# Patient Record
Sex: Male | Born: 1980 | Race: White | Hispanic: No | Marital: Single | Smoking: Current every day smoker
Health system: Southern US, Community
[De-identification: ages and names within clinical notes are randomized; demographics above are authoritative.]

## PROBLEM LIST (undated history)

## (undated) DIAGNOSIS — I10 Essential (primary) hypertension: Secondary | ICD-10-CM

## (undated) HISTORY — DX: Essential (primary) hypertension: I10

---

## 1999-08-13 ENCOUNTER — Other Ambulatory Visit: Admission: RE | Admit: 1999-08-13 | Discharge: 1999-08-13 | Payer: Self-pay | Admitting: Otolaryngology

## 2004-02-01 ENCOUNTER — Ambulatory Visit: Payer: Self-pay | Admitting: Internal Medicine

## 2004-08-05 ENCOUNTER — Emergency Department (HOSPITAL_COMMUNITY): Admission: EM | Admit: 2004-08-05 | Discharge: 2004-08-05 | Payer: Self-pay | Admitting: Emergency Medicine

## 2010-08-05 ENCOUNTER — Inpatient Hospital Stay (INDEPENDENT_AMBULATORY_CARE_PROVIDER_SITE_OTHER)
Admission: RE | Admit: 2010-08-05 | Discharge: 2010-08-05 | Disposition: A | Discharge status: Home / Self Care | Payer: Self-pay | Source: Ambulatory Visit | Attending: Family Medicine | Admitting: Family Medicine

## 2010-08-05 DIAGNOSIS — H6690 Otitis media, unspecified, unspecified ear: Secondary | ICD-10-CM

## 2010-08-05 DIAGNOSIS — H6120 Impacted cerumen, unspecified ear: Secondary | ICD-10-CM

## 2011-05-24 ENCOUNTER — Ambulatory Visit: Payer: Self-pay | Admitting: Family Medicine

## 2011-05-24 VITALS — BP 123/86 | HR 83 | Temp 98.5°F | Resp 16 | Ht 70.75 in | Wt 277.0 lb

## 2011-05-24 DIAGNOSIS — I10 Essential (primary) hypertension: Secondary | ICD-10-CM

## 2011-05-24 MED ORDER — AMLODIPINE BESYLATE 5 MG PO TABS: 5.0000 mg | ORAL_TABLET | Freq: Every day | ORAL | Status: DC

## 2011-05-24 NOTE — Progress Notes (Signed)
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  Has 32 year old.  Has been getting elevated bp readings at drug store for awhile, and parents both have hypertension.    Also complains of intermittent headaches  O:  BP 134/84 Chest clear Heart reg, no murmur or gallop Abdomen:  Soft, nontender, no HSM  A:  Borderline htn  P:  Amlodipine 5mg  daily, followup in 6 weeks

## 2011-05-24 NOTE — Patient Instructions (Signed)

## 2012-07-28 ENCOUNTER — Ambulatory Visit: Payer: Self-pay | Admitting: Family Medicine

## 2012-07-28 VITALS — BP 110/72 | HR 75 | Temp 98.3°F | Resp 16 | Ht 70.0 in | Wt 278.0 lb

## 2012-07-28 DIAGNOSIS — J029 Acute pharyngitis, unspecified: Secondary | ICD-10-CM

## 2012-07-28 DIAGNOSIS — J020 Streptococcal pharyngitis: Secondary | ICD-10-CM

## 2012-07-28 DIAGNOSIS — B9789 Other viral agents as the cause of diseases classified elsewhere: Secondary | ICD-10-CM | POA: Insufficient documentation

## 2012-07-28 LAB — POCT RAPID STREP A (OFFICE): Rapid Strep A Screen: NEGATIVE

## 2012-07-28 MED ORDER — FLUTICASONE PROPIONATE 50 MCG/ACT NA SUSP: 2.0000 | Freq: Every day | NASAL | Status: DC

## 2012-07-28 NOTE — Assessment & Plan Note (Signed)
Symptomatic management with Tylenol or ibuprofen. Additionally this may be postnasal drip due to seasonal allergies. Prescribe Flonase and instructed patient how to take it. Will followup as needed

## 2012-07-28 NOTE — Progress Notes (Signed)
Jay Hurst is a 32 y.o. male who presents to [facility] today for sore throat associated with right knee injection for the last 5 days. He notes that the eye injection is slowly improving and associated with occasional crusting especially in the morning. His sore throat continues to be mildly bothersome. He notes that ibuprofen helps a lot. Additionally he notes a mild productive cough in the morning. He denies any fevers chills difficulty breathing or chest pain. He feels well otherwise.   He has a pertinent past medical history for multiple strep throat with subsequent tonsillectomy   PMH: Reviewed hypertension History  Substance Use Topics  . Smoking status: Current Every Day Smoker  . Smokeless tobacco: Not on file  . Alcohol Use: Not on file   ROS as above  Medications reviewed. Current Outpatient Prescriptions  Medication Sig Dispense Refill  . buprenorphine (SUBUTEX) 2 MG SUBL Place 2 mg under the tongue 2 (two) times daily.      Marland Kitchen amLODipine (NORVASC) 5 MG tablet Take 1 tablet (5 mg total) by mouth daily.  90 tablet  3   No current facility-administered medications for this visit.    Exam:  BP 110/72  Pulse 75  Temp(Src) 98.3 F (36.8 C) (Oral)  Resp 16  Ht 5\' 10"  (1.778 m)  Wt 278 lb (126.1 kg)  BMI 39.89 kg/m2  SpO2 97% Gen: Well NAD HEENT: EOMI,  MMM, difficult to ascertain posterior pharynx. Mildly erythematous no exudate.  Very mild right eye conjunctiva injection not on the left eye.  PERRL Lungs: CTABL Nl WOB Heart: RRR no MRG Abd: NABS, NT, ND Exts: Non edematous BL  LE, warm and well perfused.   Results for orders placed in visit on 07/28/12 (from the past 72 hour(s))  POCT RAPID STREP A (OFFICE)     Status: None   Collection Time    07/28/12  6:34 PM      Result Value Range   Rapid Strep A Screen Negative  Negative    Assessment and Plan: 32 y.o. male with Symptomatic management with Tylenol or ibuprofen. Additionally this may be postnasal drip due to  seasonal allergies. Prescribe Flonase and instructed patient how to take it. Will followup as needed

## 2012-07-28 NOTE — Patient Instructions (Signed)
Thank you for coming in today. YOur strep screen is negative.  You likely have virus infection. Continue Tylenol or ibuprofen for sore throat This will continue to improve. Consider taking Flonase for nose congestion and itching and sore throat.

## 2012-08-02 ENCOUNTER — Other Ambulatory Visit: Payer: Self-pay | Admitting: Family Medicine

## 2012-09-07 ENCOUNTER — Telehealth: Payer: Self-pay

## 2012-09-07 NOTE — Telephone Encounter (Signed)
Patient has a question about his amlodipine please call patient at [phone]

## 2012-09-09 NOTE — Telephone Encounter (Signed)
FYI: Pt does not have insurance. We gave him a year supply last year. Pt never followed up. Pt will not come in for an OV due to cost. He will call us to make an appt as soon as he has the money available.

## 2012-09-10 ENCOUNTER — Other Ambulatory Visit: Payer: Self-pay | Admitting: Family Medicine

## 2012-09-10 DIAGNOSIS — I10 Essential (primary) hypertension: Secondary | ICD-10-CM

## 2012-09-10 MED ORDER — AMLODIPINE BESYLATE 5 MG PO TABS: 5.0000 mg | ORAL_TABLET | Freq: Every day | ORAL | Status: DC

## 2013-03-21 ENCOUNTER — Ambulatory Visit (INDEPENDENT_AMBULATORY_CARE_PROVIDER_SITE_OTHER): Payer: Commercial Managed Care - PPO | Admitting: Family Medicine

## 2013-03-21 VITALS — BP 142/76 | HR 108 | Temp 98.2°F | Resp 20 | Ht 70.0 in | Wt 290.0 lb

## 2013-03-21 DIAGNOSIS — R05 Cough: Secondary | ICD-10-CM

## 2013-03-21 DIAGNOSIS — R51 Headache: Secondary | ICD-10-CM

## 2013-03-21 DIAGNOSIS — R059 Cough, unspecified: Secondary | ICD-10-CM

## 2013-03-21 MED ORDER — AZITHROMYCIN 250 MG PO TABS: ORAL_TABLET | ORAL | Status: DC

## 2013-03-21 MED ORDER — METOPROLOL SUCCINATE ER 100 MG PO TB24: 100.0000 mg | ORAL_TABLET | Freq: Every day | ORAL | Status: DC

## 2013-03-21 MED ORDER — KETOPROFEN 50 MG PO CAPS: 50.0000 mg | ORAL_CAPSULE | Freq: Three times a day (TID) | ORAL | Status: DC | PRN

## 2013-03-21 NOTE — Patient Instructions (Signed)
Migraine Headache A migraine headache is an intense, throbbing pain on one or both sides of your head. A migraine can last for 30 minutes to several hours. CAUSES  The exact cause of a migraine headache is not always known. However, a migraine may be caused when nerves in the brain become irritated and release chemicals that cause inflammation. This causes pain. Certain things may also trigger migraines, such as:  Alcohol.  Smoking.  Stress.  Menstruation.  Aged cheeses.  Foods or drinks that contain nitrates, glutamate, aspartame, or tyramine.  Lack of sleep.  Chocolate.  Caffeine.  Hunger.  Physical exertion.  Fatigue.  Medicines used to treat chest pain (nitroglycerine), birth control pills, estrogen, and some blood pressure medicines. SIGNS AND SYMPTOMS  Pain on one or both sides of your head.  Pulsating or throbbing pain.  Severe pain that prevents daily activities.  Pain that is aggravated by any physical activity.  Nausea, vomiting, or both.  Dizziness.  Pain with exposure to bright lights, loud noises, or activity.  General sensitivity to bright lights, loud noises, or smells. Before you get a migraine, you may get warning signs that a migraine is coming (aura). An aura may include:  Seeing flashing lights.  Seeing bright spots, halos, or zig-zag lines.  Having tunnel vision or blurred vision.  Having feelings of numbness or tingling.  Having trouble talking.  Having muscle weakness. DIAGNOSIS  A migraine headache is often diagnosed based on:  Symptoms.  Physical exam.  A CT scan or MRI of your head. These imaging tests cannot diagnose migraines, but they can help rule out other causes of headaches. TREATMENT Medicines may be given for pain and nausea. Medicines can also be given to help prevent recurrent migraines.  HOME CARE INSTRUCTIONS  Only take over-the-counter or prescription medicines for pain or discomfort as directed by your  health care provider. The use of long-term narcotics is not recommended.  Lie down in a dark, quiet room when you have a migraine.  Keep a journal to find out what may trigger your migraine headaches. For example, write down:  What you eat and drink.  How much sleep you get.  Any change to your diet or medicines.  Limit alcohol consumption.  Quit smoking if you smoke.  Get 7 9 hours of sleep, or as recommended by your health care provider.  Limit stress.  Keep lights dim if bright lights bother you and make your migraines worse. SEEK IMMEDIATE MEDICAL CARE IF:   Your migraine becomes severe.  You have a fever.  You have a stiff neck.  You have vision loss.  You have muscular weakness or loss of muscle control.  You start losing your balance or have trouble walking.  You feel faint or pass out.  You have severe symptoms that are different from your first symptoms. MAKE SURE YOU:   Understand these instructions.  Will watch your condition.  Will get help right away if you are not doing well or get worse. Document Released: 02/03/2005 Document Revised: 11/24/2012 Document Reviewed: 10/11/2012 Exit[employer] Patient Information 2014 [employer].  

## 2013-03-21 NOTE — Progress Notes (Signed)
Subjective:    Patient ID: Jay Hurst, male    DOB: 1981/02/14, 33 y.o.   MRN: [id number]  Headache  Associated symptoms include coughing.  Cough Associated symptoms include headaches. Pertinent negatives include no wheezing.       Patient Name: Jay Hurst Date of Birth: 1981/02/14 Medical Record Number: [id number] Gender: male Date of Encounter: 03/21/2013  Chief Complaint: Headache and Cough   History of Present Illness:  Jay Hurst is a 33 y.o. very pleasant male patient with a hx of HTN who presents with the following:  1x month of left sided headaches, and he has been experiencing high BP. He has been taking Advil which typically works for the headache. But the Head aches have become constant and have gotten worse in the last 3x days and localized on the left side. He states that the headaches do affect his vision slightly. Pt also complains of 1x week of cough. He He states that he has headaches before but they have not been this severe. He states that the HA does not wake him up at night.  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  Pt is a daily smoker  PCP: Dr. Evelene CroonKaur  Patient Active Problem List   Diagnosis Date Noted   Sore throat (viral) 07/28/2012   Past Medical History  Diagnosis Date   Hypertension    History reviewed. No pertinent past surgical history. History  Substance Use Topics   Smoking status: Current Every Day Smoker   Smokeless tobacco: Not on file   Alcohol Use: Not on file   Family History  Problem Relation Age of Onset   Hypertension Mother    No Known Allergies  Medication list has been reviewed and updated.  Current Outpatient Prescriptions on File Prior to Visit  Medication Sig Dispense Refill   buprenorphine (SUBUTEX) 2 MG SUBL Place 2 mg under the tongue 2 (two) times daily.       No current facility-administered medications on file prior to visit.     Review of Systems:    Physical Examination: Filed Vitals:   03/21/13 1816  BP: 142/76  Pulse: 108  Temp: 98.2 F (36.8 C)  Resp: 20    Body mass index is 41.61 kg/(m^2).   Patient is alert and in no acute distress although he is overweight HEENT including funduscopic exam: Totally negative Neck: Supple no adenopathy or thyromegaly Chest: Clear Heart: Regular no murmur Neurological: Intact cranial nerves III through XII, motor exam, sensory exam  EKG / Labs / Xrays: None available at time of encounter  Assessment and Plan: A CT of his head and a new prescription for BP and headaches.   [facility] reading (PRIMARY) by  Dr. Milus GlazierLauenstein.   Review of Systems  Eyes: Positive for visual disturbance.  Respiratory: Positive for cough. Negative for wheezing.   Neurological: Positive for headaches.  All other systems reviewed and are negative.       Objective:   Physical Exam  Nursing note and vitals reviewed. Constitutional: He is oriented to person, place, and time. He appears well-developed and well-nourished. No distress.  HENT:  Head: Normocephalic and atraumatic.  Eyes: EOM are normal.  Neck: Neck supple. No tracheal deviation present.  Cardiovascular: Normal rate.   Pulmonary/Chest: Effort normal. No respiratory distress.  Mild bronchitis; ronchi  Musculoskeletal: Normal range of motion.  Neurological: He is  alert and oriented to person, place, and time.  Skin: Skin is warm and dry.  Psychiatric: He has a normal mood and affect. His behavior is normal.        Assessment & Plan:  Headache(784.0) - Plan: CT Head Wo Contrast, metoprolol succinate (TOPROL-XL) 100 MG 24 hr tablet, ketoprofen (ORUDIS) 50 MG capsule  Cough - Plan: azithromycin (ZITHROMAX Z-PAK) 250 MG tablet  Signed, Elvina Sidle, MD

## 2013-03-22 ENCOUNTER — Ambulatory Visit
Admission: RE | Admit: 2013-03-22 | Discharge: 2013-03-22 | Disposition: A | Discharge status: Home / Self Care | Payer: Commercial Managed Care - PPO | Source: Ambulatory Visit | Attending: Family Medicine | Admitting: Family Medicine

## 2013-03-22 DIAGNOSIS — R51 Headache: Secondary | ICD-10-CM

## 2017-01-11 ENCOUNTER — Emergency Department (HOSPITAL_COMMUNITY): Payer: 59

## 2017-01-11 ENCOUNTER — Emergency Department (HOSPITAL_COMMUNITY)
Admission: EM | Admit: 2017-01-11 | Discharge: 2017-01-11 | Discharge status: Against Medical Advice | Payer: 59 | Attending: Emergency Medicine | Admitting: Emergency Medicine

## 2017-01-11 ENCOUNTER — Other Ambulatory Visit: Payer: Self-pay

## 2017-01-11 ENCOUNTER — Encounter (HOSPITAL_COMMUNITY): Payer: Self-pay | Admitting: Emergency Medicine

## 2017-01-11 DIAGNOSIS — R930 Abnormal findings on diagnostic imaging of skull and head, not elsewhere classified: Secondary | ICD-10-CM | POA: Diagnosis not present

## 2017-01-11 DIAGNOSIS — F17200 Nicotine dependence, unspecified, uncomplicated: Secondary | ICD-10-CM | POA: Diagnosis not present

## 2017-01-11 DIAGNOSIS — S0990XA Unspecified injury of head, initial encounter: Secondary | ICD-10-CM | POA: Diagnosis present

## 2017-01-11 DIAGNOSIS — I10 Essential (primary) hypertension: Secondary | ICD-10-CM | POA: Diagnosis not present

## 2017-01-11 DIAGNOSIS — S060X1A Concussion with loss of consciousness of 30 minutes or less, initial encounter: Secondary | ICD-10-CM | POA: Diagnosis not present

## 2017-01-11 DIAGNOSIS — Y9351 Activity, roller skating (inline) and skateboarding: Secondary | ICD-10-CM | POA: Diagnosis not present

## 2017-01-11 DIAGNOSIS — Y999 Unspecified external cause status: Secondary | ICD-10-CM | POA: Insufficient documentation

## 2017-01-11 DIAGNOSIS — Z79899 Other long term (current) drug therapy: Secondary | ICD-10-CM | POA: Insufficient documentation

## 2017-01-11 DIAGNOSIS — Y929 Unspecified place or not applicable: Secondary | ICD-10-CM | POA: Insufficient documentation

## 2017-01-11 LAB — CBC WITH DIFFERENTIAL/PLATELET
Basophils Absolute: 0 10*3/uL (ref 0.0–0.1)
Basophils Relative: 0 %
Eosinophils Absolute: 0.2 10*3/uL (ref 0.0–0.7)
Eosinophils Relative: 2 %
HEMATOCRIT: 47.5 % (ref 39.0–52.0)
HEMOGLOBIN: 16 g/dL (ref 13.0–17.0)
LYMPHS ABS: 2.1 10*3/uL (ref 0.7–4.0)
Lymphocytes Relative: 21 %
MCH: 31.8 pg (ref 26.0–34.0)
MCHC: 33.7 g/dL (ref 30.0–36.0)
MCV: 94.4 fL (ref 78.0–100.0)
Monocytes Absolute: 0.6 10*3/uL (ref 0.1–1.0)
Monocytes Relative: 6 %
NEUTROS ABS: 7 10*3/uL (ref 1.7–7.7)
Neutrophils Relative %: 71 %
PLATELETS: 282 10*3/uL (ref 150–400)
RBC: 5.03 MIL/uL (ref 4.22–5.81)
RDW: 13.4 % (ref 11.5–15.5)
WBC: 9.9 10*3/uL (ref 4.0–10.5)

## 2017-01-11 LAB — BASIC METABOLIC PANEL
Anion gap: 9 (ref 5–15)
BUN: 16 mg/dL (ref 6–20)
CHLORIDE: 97 mmol/L — AB (ref 101–111)
CO2: 27 mmol/L (ref 22–32)
Calcium: 8.8 mg/dL — ABNORMAL LOW (ref 8.9–10.3)
Creatinine, Ser: 0.92 mg/dL (ref 0.61–1.24)
GFR calc Af Amer: >60 mL/min (ref >60)
GFR calc non Af Amer: >60 mL/min (ref >60)
GLUCOSE: 127 mg/dL — AB (ref 65–99)
POTASSIUM: 3.4 mmol/L — AB (ref 3.5–5.1)
Sodium: 133 mmol/L — ABNORMAL LOW (ref 135–145)

## 2017-01-11 MED ORDER — ONDANSETRON 4 MG PO TBDP: 4.0000 mg | ORAL_TABLET | Freq: Three times a day (TID) | ORAL | 0 refills | Status: DC | PRN

## 2017-01-11 MED ORDER — DIPHENHYDRAMINE HCL 50 MG/ML IJ SOLN
12.5000 mg | Freq: Once | INTRAMUSCULAR | Status: AC
  Administered 2017-01-11: 12.5 mg via INTRAVENOUS
  Filled 2017-01-11: qty 1

## 2017-01-11 MED ORDER — METOCLOPRAMIDE HCL 5 MG/ML IJ SOLN
10.0000 mg | Freq: Once | INTRAMUSCULAR | Status: AC
  Administered 2017-01-11: 10 mg via INTRAVENOUS
  Filled 2017-01-11: qty 2

## 2017-01-11 MED ORDER — DEXAMETHASONE SODIUM PHOSPHATE 10 MG/ML IJ SOLN
10.0000 mg | Freq: Once | INTRAMUSCULAR | Status: AC
  Administered 2017-01-11: 10 mg via INTRAVENOUS
  Filled 2017-01-11: qty 1
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
## 2017-01-11 NOTE — Discharge Instructions (Signed)
Please call Dr. Yetta BarreJones' office to schedule a recheck appointment in one week. It is important to get a repeat head CT to check symptoms. Please know that you can return to the emergency department any time you feel you need to be seen.

## 2017-01-11 NOTE — ED Notes (Signed)
Pt wants to leave, does not want to wait any longer for neurosurgery consult.  Pt would be open to someone calling him if there is something significant to report.

## 2017-01-11 NOTE — ED Notes (Signed)
Pt signed out AMA.  Discharge instructions given to pt.  Pt verbalized understanding.
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
## 2017-01-17 ENCOUNTER — Other Ambulatory Visit: Payer: Self-pay | Admitting: Student

## 2017-01-17 DIAGNOSIS — S062X1A Diffuse traumatic brain injury with loss of consciousness of 30 minutes or less, initial encounter: Secondary | ICD-10-CM

## 2017-02-02 ENCOUNTER — Ambulatory Visit
Admission: RE | Admit: 2017-02-02 | Discharge: 2017-02-02 | Disposition: A | Discharge status: Home / Self Care | Payer: 59 | Source: Ambulatory Visit | Attending: Student | Admitting: Student

## 2017-02-02 DIAGNOSIS — S062X1A Diffuse traumatic brain injury with loss of consciousness of 30 minutes or less, initial encounter: Secondary | ICD-10-CM

## 2017-08-03 ENCOUNTER — Encounter (HOSPITAL_COMMUNITY): Payer: Self-pay

## 2017-08-03 ENCOUNTER — Other Ambulatory Visit: Payer: Self-pay

## 2017-08-03 ENCOUNTER — Ambulatory Visit (HOSPITAL_COMMUNITY)
Admission: EM | Admit: 2017-08-03 | Discharge: 2017-08-03 | Disposition: A | Discharge status: Home / Self Care | Payer: BLUE CROSS/BLUE SHIELD

## 2017-08-03 DIAGNOSIS — N529 Male erectile dysfunction, unspecified: Secondary | ICD-10-CM

## 2017-08-03 DIAGNOSIS — R030 Elevated blood-pressure reading, without diagnosis of hypertension: Secondary | ICD-10-CM

## 2017-08-03 MED ORDER — SILDENAFIL CITRATE 25 MG PO TABS: 25.0000 mg | ORAL_TABLET | Freq: Every day | ORAL | 0 refills | Status: DC | PRN

## 2017-08-03 NOTE — ED Triage Notes (Signed)
[facility] x1 month, pt denies any injuries

## 2017-08-03 NOTE — ED Provider Notes (Signed)
  MRN: [id number] DOB: 1980/11/08  Subjective:   Marcello FennelJames L Kachel is a 37 y.o. male presenting for difficult obtaining an erection. Tried to have sex with a new male partner this past weekend. Has a history of drug abuse, alcohol abuse, admits that this was the first time that he tried to have sex without drugs. Denies history of heart disease, hyperlipidemia, HTN. Smokes 1ppd. 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   No current facility-administered medications for this encounter.   Current Outpatient Medications:  .  amphetamine-dextroamphetamine (ADDERALL) 10 MG tablet, Take 40 mg by mouth daily with breakfast., Disp: , Rfl:  .  Buprenorphine HCl-Naloxone HCl 8-2 MG FILM, PLACE 1 STRIP UNDER THE TONGUE ONCE DAILY, Disp: , Rfl: 0   No Known Allergies  Past Medical History:  Diagnosis Date  . Hypertension     History reviewed. No pertinent surgical history.  Objective:   Vitals: BP (!) 155/102 (BP Location: Left Arm)   Pulse 91   Temp 98.8 F (37.1 C) (Oral)   Resp 17   SpO2 98%   BP Readings from Last 3 Encounters:  08/03/17 (!) 155/102  01/11/17 127/72  03/21/13 (!) 142/76    Physical Exam  Constitutional: He is oriented to person, place, and time. He appears well-developed and well-nourished.  Cardiovascular: Normal rate.  Pulmonary/Chest: Effort normal.  Neurological: He is alert and oriented to person, place, and time.  Psychiatric: He has a normal mood and affect.   Assessment and Plan :   Erectile dysfunction, unspecified erectile dysfunction type  Elevated blood pressure reading  Patient will set up primary care with me to start work up for his ED. Offered just 4 tabs of sildenafil in the meantime. Counseled patient on potential for adverse effects with medications prescribed today, patient verbalized understanding. He is to start behavioral therapy as well.

## 2017-08-03 NOTE — Discharge Instructions (Addendum)
Wallis BambergMario Djeneba Barsch, PA-C [facility] [phone] [address], [place], [place] [address]   [facility] [address] [place], [place] [address]   Shanon RosserBarbara Farran [phone]  Maris BergerKathy Kirstner [phone]  Marco CollieSusan Kroll-Smith [phone]   [facility] ([address]eth Hulan AmatoKincaid, Ernest Beckey RutterMcCoy, Heather Joycelyn SchmidKitchens, Karla Salisburyownsend) - [phone]  [facility](Julie ClinchcoWhitt) - [phone]  [facility] - [phone]  [facility] - [phone]  Buena IrishBob Mylan - [phone]  [facility]  - [phone] (do not file insurance)

## 2017-08-03 NOTE — ED Notes (Signed)
Pt discharged by provider.

## 2019-07-22 IMAGING — CT CT HEAD W/O CM
3 series · 15 of 47 positions shown, 18 images · non-contrast
Comparison: 03/22/2013 CT head.

CLINICAL DATA: 36 y/o M; fell off scooter with headache and
lethargy.

EXAM:
CT HEAD WITHOUT CONTRAST
TECHNIQUE: Contiguous axial images were obtained from the base of the skull
through the vertex without intravenous contrast.

[Series 3: head 5.0 h30s · axial · 0.44mm/px · z∈[-99,+41]mm · 9 of 34 slices shown, 12 images]
[im 3/34  brain]
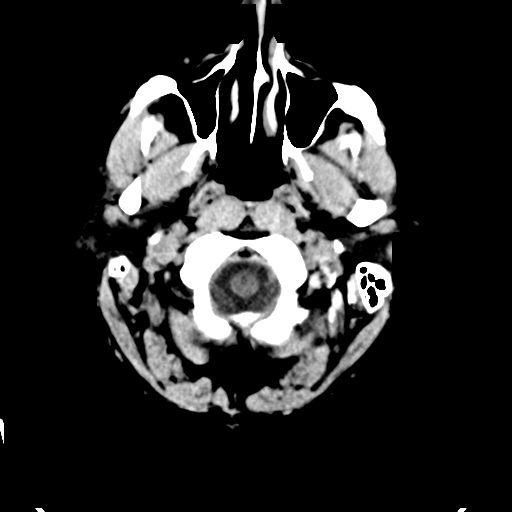
[im 3/34  bone]
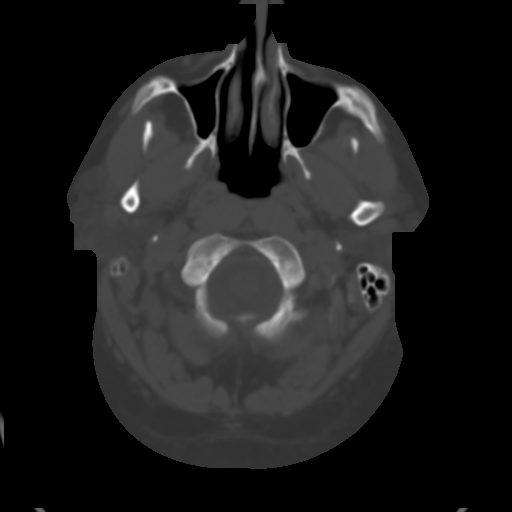
[im 6/34  brain]
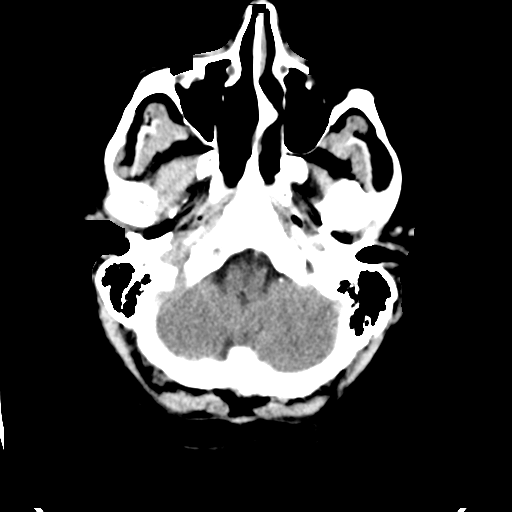
[im 10/34  brain]
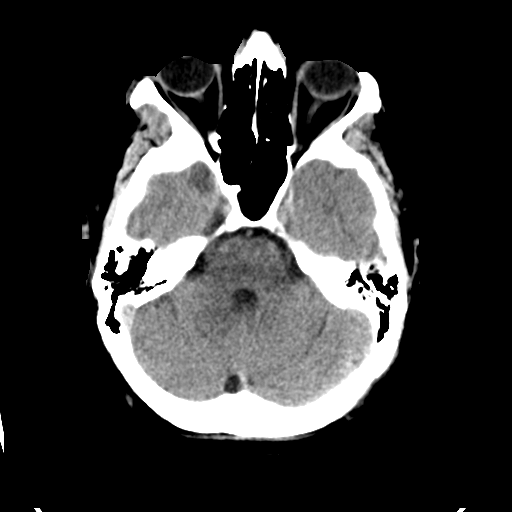
[im 13/34  brain]
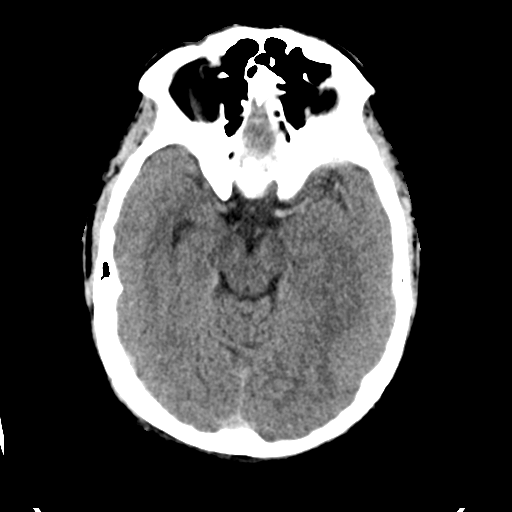
[im 18/34  brain]
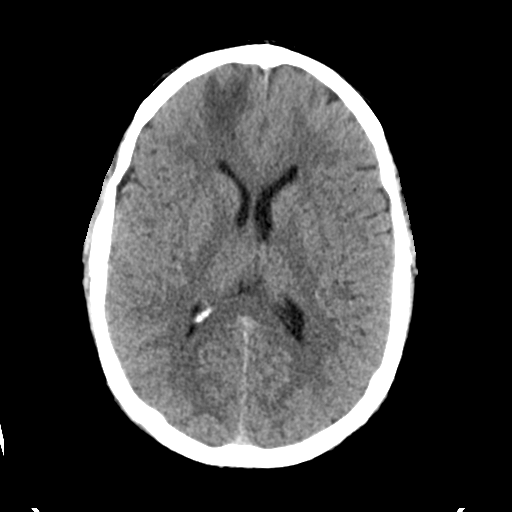
[im 18/34  bone]
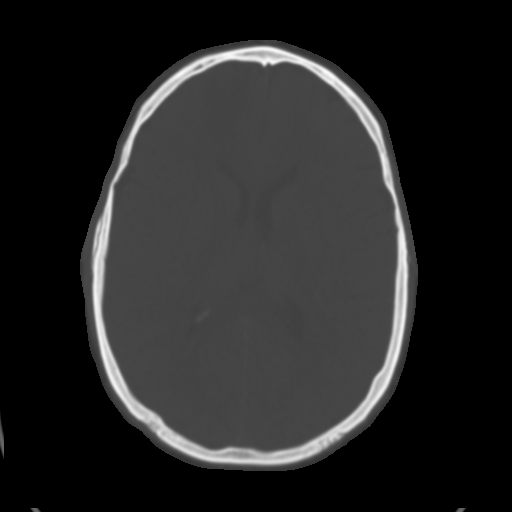
[im 21/34  brain]
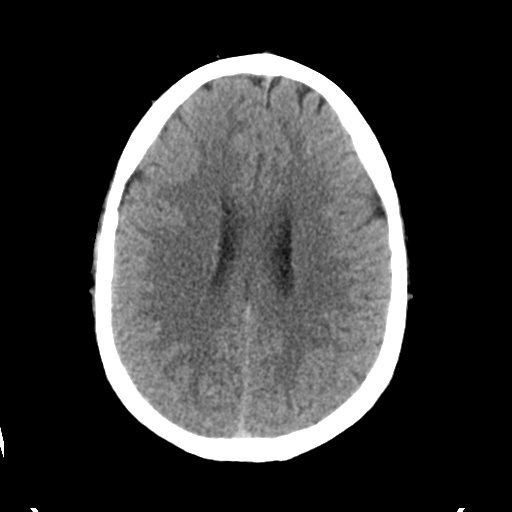
[im 24/34  brain]
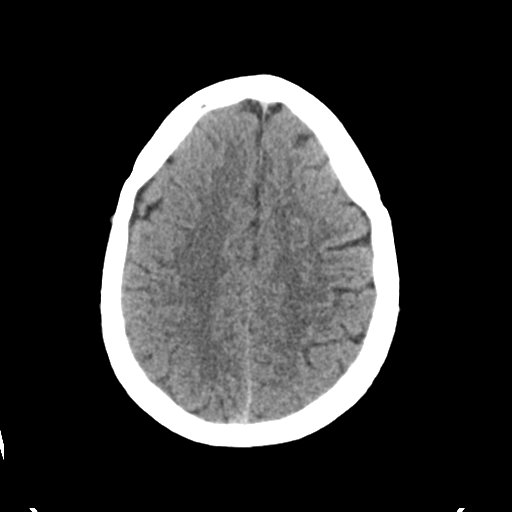
[im 28/34  brain]
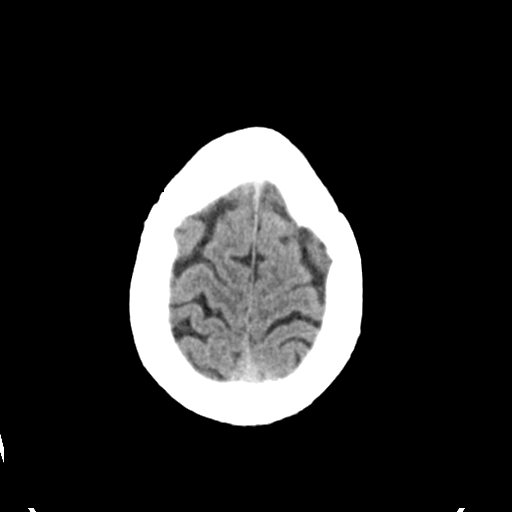
[im 31/34  brain]
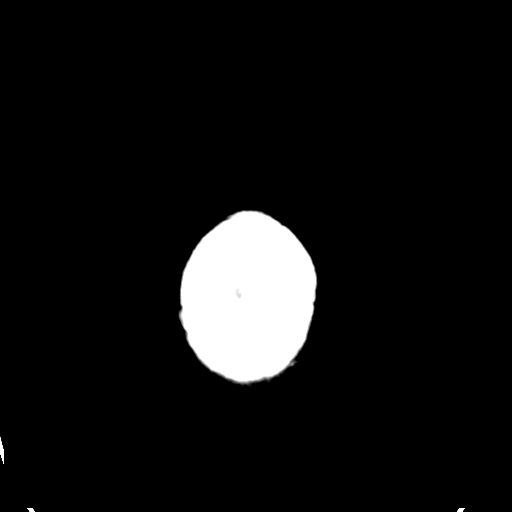
[im 31/34  bone]
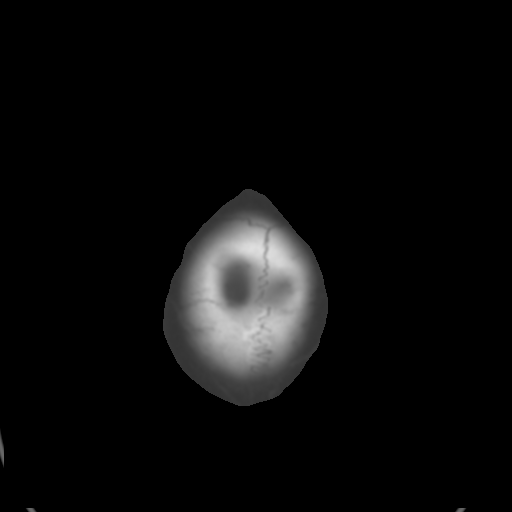

[Series 5: head 3.0 mpr cor · coronal · 0.34mm/px · 3 of 74 slices shown]
[im 25/74  brain]
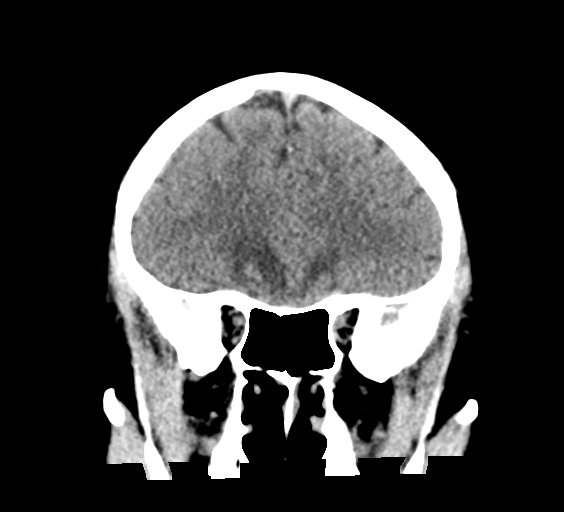
[im 33/74  brain]
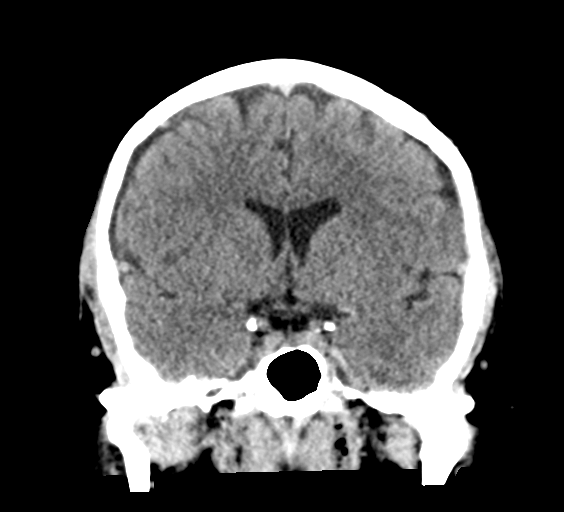
[im 41/74  brain]
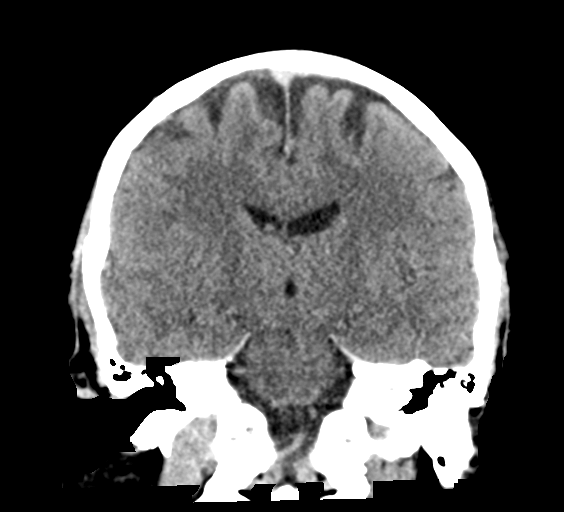

[Series 6: head 3.0 mpr sag · sagittal · 0.36mm/px · 3 of 67 slices shown]
[im 23/67  brain]
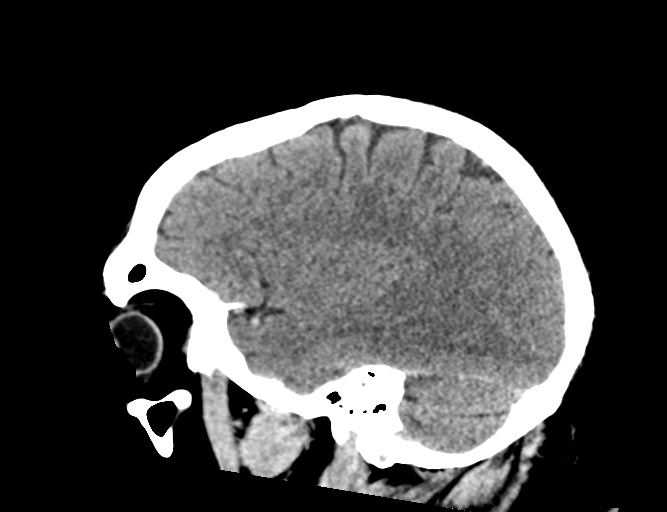
[im 34/67  brain]
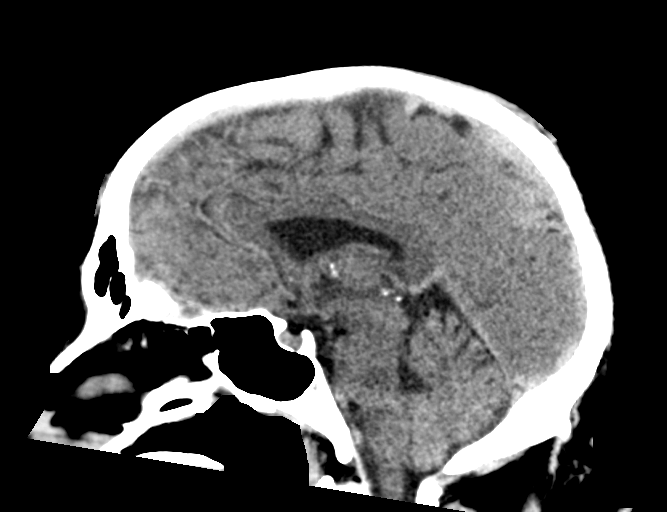
[im 45/67  brain]
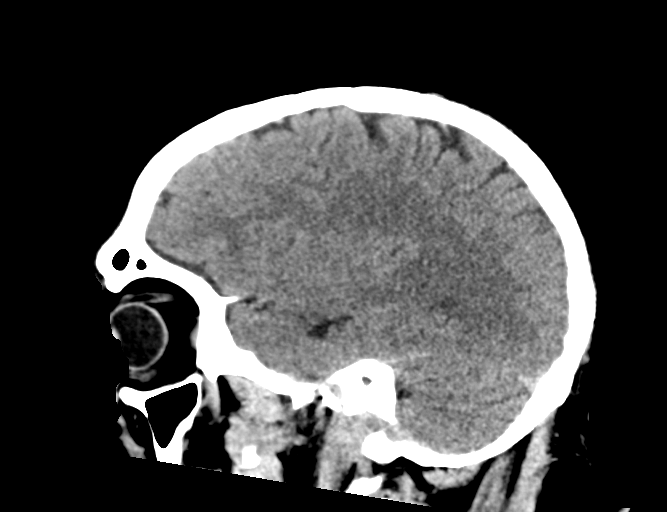

[15 of 47 positions shown; findings below may reference images not displayed]

FINDINGS: Brain: Bilateral paramedian anterior frontal hemorrhagic cortical
contusions in small cortical contusion of the right anterior
temporal lobe. Mild local mass effect. No extra-axial collection
identified. No additional area of brain parenchymal hemorrhage,
stroke, or mass effect. No hydrocephalus or effacement of basilar
cisterns.

Vascular: No hyperdense vessel or unexpected calcification.

Skull: No acute fracture identified. Soft tissue thickening in the
parietal scalp lesion may represent contusion.

Sinuses/Orbits: Mild anterior ethmoid and frontal sinus mucosal
thickening. Otherwise negative.

Other: None.
IMPRESSION: Bilateral frontal orbital hemorrhagic cortical contusions and small
non hemorrhagic cortical contusion of right anterior temporal lobe.
Mild local mass effect. No herniation or extra-axial collection. No
calvarial fracture.

Critical Value/emergent results were called by telephone at the time
of interpretation on 01/11/2017 at [DATE] to Dr. PC LINK CHELY ,
who verbally acknowledged these results.

By: Reitze Akiki M.D.

## 2021-07-13 ENCOUNTER — Ambulatory Visit
Admission: EM | Admit: 2021-07-13 | Discharge: 2021-07-13 | Disposition: A | Discharge status: Home / Self Care | Payer: 59 | Attending: Family Medicine | Admitting: Family Medicine

## 2021-07-13 ENCOUNTER — Encounter: Payer: Self-pay | Admitting: Emergency Medicine

## 2021-07-13 ENCOUNTER — Other Ambulatory Visit: Payer: Self-pay

## 2021-07-13 DIAGNOSIS — N529 Male erectile dysfunction, unspecified: Secondary | ICD-10-CM

## 2021-07-13 MED ORDER — SILDENAFIL CITRATE 25 MG PO TABS: 25.0000 mg | ORAL_TABLET | Freq: Every day | ORAL | 0 refills | Status: AC | PRN

## 2021-07-13 NOTE — ED Triage Notes (Signed)
Pt here for "performance issues" and requesting RX that he has gotten in past

## 2021-07-13 NOTE — Discharge Instructions (Addendum)
Takes sildenafil 25 mg--1-2 daily about an hour before you need it.  Not take it more than once daily

## 2021-07-13 NOTE — ED Provider Notes (Addendum)
Had [facility]    CSN: [id number] Arrival date & time: 07/13/21  1420      History   Chief Complaint Chief Complaint  Patient presents with   Medication Refill    HPI Jay Hurst is a 41 y.o. male.    Medication Refill Here for erectile dysfunction. Intermittent trouble with this before, and Viagra has worked well for him.  He would like to try a small prescription of that to use as needed.  He does not have primary care.  He has no chest pain, shortness of breath or fever or chills.  He does not take any nitrates.  He takes Suboxone and Adderall.   Past Medical History:  Diagnosis Date   Hypertension     Patient Active Problem List   Diagnosis Date Noted   Sore throat (viral) 07/28/2012    History reviewed. No pertinent surgical history.     Home Medications    Prior to Admission medications   Medication Sig Start Date End Date Taking? Authorizing Provider  amphetamine-dextroamphetamine (ADDERALL) 10 MG tablet Take 40 mg by mouth daily with breakfast.    [provider]  Buprenorphine HCl-Naloxone HCl 8-2 MG FILM PLACE 1 STRIP UNDER THE TONGUE ONCE DAILY 07/24/17   [provider]  sildenafil (VIAGRA) 25 MG tablet Take 1-2 tablets (25-50 mg total) by mouth daily as needed for erectile dysfunction. 07/13/21   Zenia Resides, MD    Family History Family History  Problem Relation Age of Onset   Hypertension Mother     Social History Social History   Tobacco Use   Smoking status: Every Day   Smokeless tobacco: Never  Substance Use Topics   Alcohol use: No   Drug use: No     Allergies   Patient has no known allergies.   Review of Systems Review of Systems   Physical Exam Triage Vital Signs ED Triage Vitals  Enc Vitals Group     BP 07/13/21 1445 (!) 165/109     Pulse Rate 07/13/21 1445 (!) 104     Resp 07/13/21 1445 18     Temp 07/13/21 1445 98.4 F (36.9 C)     Temp Source 07/13/21 1445 Oral      SpO2 07/13/21 1445 95 %     Weight --      Height --      Head Circumference --      Peak Flow --      Pain Score 07/13/21 1446 0     Pain Loc --      Pain Edu? --      Excl. in GC? --    No data found.  Updated Vital Signs BP (!) 165/109 (BP Location: Left Arm)   Pulse (!) 104   Temp 98.4 F (36.9 C) (Oral)   Resp 18   SpO2 95%   Visual Acuity Right Eye Distance:   Left Eye Distance:   Bilateral Distance:    Right Eye Near:   Left Eye Near:    Bilateral Near:     Physical Exam Vitals reviewed.  Constitutional:      General: He is not in acute distress.    Appearance: He is not toxic-appearing.  HENT:     Mouth/Throat:     Mouth: Mucous membranes are moist.  Cardiovascular:     Rate and Rhythm: Normal rate and regular rhythm.     Heart sounds: No murmur heard. Pulmonary:  Effort: Pulmonary effort is normal.     Breath sounds: Normal breath sounds.  Neurological:     Mental Status: He is alert and oriented to person, place, and time.  Psychiatric:        Behavior: Behavior normal.     UC Treatments / Results  Labs (all labs ordered are listed, but only abnormal results are displayed) Labs Reviewed - No data to display  EKG   Radiology No results found.  Procedures Procedures (including critical care time)  Medications Ordered in UC Medications - No data to display  Initial Impression / Assessment and Plan / UC Course  I have reviewed the triage vital signs and the nursing notes.  Pertinent labs & imaging results that were available during my care of the patient were reviewed by me and considered in my medical decision making (see chart for details).     I will send in a small rx of viagra for him, and assistance to find a pcp was requested. Final Clinical Impressions(s) / UC Diagnoses   Final diagnoses:  Erectile dysfunction, unspecified erectile dysfunction type     Discharge Instructions      Takes sildenafil 25 mg--1-2 daily  about an hour before you need it.  Not take it more than once daily     ED Prescriptions     Medication Sig Dispense Auth. Provider   sildenafil (VIAGRA) 25 MG tablet Take 1-2 tablets (25-50 mg total) by mouth daily as needed for erectile dysfunction. 4 tablet Terrill Wauters, Janace Aris, MD      I have reviewed the PDMP during this encounter.   Zenia Resides, MD 07/13/21 1518    Zenia Resides, MD 07/13/21 [phone]

## 2021-08-02 ENCOUNTER — Encounter: Payer: Self-pay | Admitting: General Practice
